# Patient Record
Sex: Male | Born: 1970 | Race: White | Hispanic: No | Marital: Married | Smoking: Former smoker
Health system: Southern US, Community
[De-identification: ages and names within clinical notes are randomized; demographics above are authoritative.]

## PROBLEM LIST (undated history)

## (undated) DIAGNOSIS — K219 Gastro-esophageal reflux disease without esophagitis: Secondary | ICD-10-CM

## (undated) HISTORY — DX: Gastro-esophageal reflux disease without esophagitis: K21.9

---
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
## 2003-10-09 ENCOUNTER — Ambulatory Visit (HOSPITAL_COMMUNITY): Admission: RE | Admit: 2003-10-09 | Discharge: 2003-10-09 | Payer: Self-pay | Admitting: General Surgery

## 2004-02-12 ENCOUNTER — Ambulatory Visit (HOSPITAL_COMMUNITY): Admission: RE | Admit: 2004-02-12 | Discharge: 2004-02-12 | Payer: Self-pay | Admitting: Internal Medicine

## 2005-04-22 ENCOUNTER — Ambulatory Visit (HOSPITAL_COMMUNITY): Admission: RE | Admit: 2005-04-22 | Discharge: 2005-04-22 | Payer: Self-pay | Admitting: Family Medicine

## 2006-05-14 ENCOUNTER — Ambulatory Visit (HOSPITAL_COMMUNITY): Admission: RE | Admit: 2006-05-14 | Discharge: 2006-05-14 | Payer: Self-pay | Admitting: Family Medicine

## 2010-04-12 ENCOUNTER — Encounter: Payer: Self-pay | Admitting: Neurology

## 2014-01-12 ENCOUNTER — Ambulatory Visit (INDEPENDENT_AMBULATORY_CARE_PROVIDER_SITE_OTHER): Payer: Self-pay | Admitting: Family Medicine

## 2014-01-12 VITALS — BP 160/86 | HR 96 | Temp 98.2°F | Resp 16 | Ht 69.0 in | Wt 182.0 lb

## 2014-01-12 DIAGNOSIS — R9431 Abnormal electrocardiogram [ECG] [EKG]: Secondary | ICD-10-CM

## 2014-01-12 DIAGNOSIS — R945 Abnormal results of liver function studies: Secondary | ICD-10-CM

## 2014-01-12 DIAGNOSIS — E785 Hyperlipidemia, unspecified: Secondary | ICD-10-CM

## 2014-01-12 DIAGNOSIS — R000 Tachycardia, unspecified: Secondary | ICD-10-CM

## 2014-01-12 DIAGNOSIS — F19939 Other psychoactive substance use, unspecified with withdrawal, unspecified: Secondary | ICD-10-CM

## 2014-01-12 DIAGNOSIS — R0789 Other chest pain: Secondary | ICD-10-CM

## 2014-01-12 DIAGNOSIS — R7989 Other specified abnormal findings of blood chemistry: Secondary | ICD-10-CM

## 2014-01-12 DIAGNOSIS — R002 Palpitations: Secondary | ICD-10-CM

## 2014-01-12 DIAGNOSIS — I10 Essential (primary) hypertension: Secondary | ICD-10-CM

## 2014-01-12 LAB — POCT CBC
Granulocyte percent: 70 %G (ref 37–80)
HCT, POC: 45.8 % (ref 43.5–53.7)
Hemoglobin: 15.1 g/dL (ref 14.1–18.1)
Lymph, poc: 2.2 (ref 0.6–3.4)
MCH, POC: 29.7 pg (ref 27–31.2)
MCHC: 33 g/dL (ref 31.8–35.4)
MCV: 90.2 fL (ref 80–97)
MID (cbc): 0.4 (ref 0–0.9)
MPV: 7.4 fL (ref 0–99.8)
POC Granulocyte: 6.1 (ref 2–6.9)
POC LYMPH PERCENT: 25.4 %L (ref 10–50)
POC MID %: 4.6 %M (ref 0–12)
Platelet Count, POC: 302 10*3/uL (ref 142–424)
RBC: 5.08 M/uL (ref 4.69–6.13)
RDW, POC: 13.7 %
WBC: 8.7 10*3/uL (ref 4.6–10.2)

## 2014-01-12 LAB — BASIC METABOLIC PANEL
BUN: 9 mg/dL (ref 6–23)
CO2: 23 mEq/L (ref 19–32)
Calcium: 9.6 mg/dL (ref 8.4–10.5)
Chloride: 103 mEq/L (ref 96–112)
Creat: 1.04 mg/dL (ref 0.50–1.35)
Glucose, Bld: 78 mg/dL (ref 70–99)
Potassium: 3.6 mEq/L (ref 3.5–5.3)
Sodium: 139 mEq/L (ref 135–145)

## 2014-01-12 LAB — TROPONIN I: Troponin I: <0.01 ng/mL (ref <0.06)

## 2014-01-12 LAB — COMPLETE METABOLIC PANEL WITH GFR
ALT: 40 U/L (ref 0–53)
AST: 23 U/L (ref 0–37)
Albumin: 4.8 g/dL (ref 3.5–5.2)
Alkaline Phosphatase: 91 U/L (ref 39–117)
BUN: 9 mg/dL (ref 6–23)
CO2: 23 mEq/L (ref 19–32)
Calcium: 9.6 mg/dL (ref 8.4–10.5)
Chloride: 103 mEq/L (ref 96–112)
Creat: 1.03 mg/dL (ref 0.50–1.35)
GFR, Est African American: >89 mL/min
GFR, Est Non African American: 89 mL/min
Glucose, Bld: 78 mg/dL (ref 70–99)
Potassium: 3.6 mEq/L (ref 3.5–5.3)
Sodium: 139 mEq/L (ref 135–145)
Total Bilirubin: 0.8 mg/dL (ref 0.2–1.2)
Total Protein: 7 g/dL (ref 6.0–8.3)

## 2014-01-12 LAB — LIPID PANEL
Cholesterol: 128 mg/dL (ref 0–200)
HDL: 37 mg/dL — ABNORMAL LOW (ref >39)
LDL Cholesterol: 79 mg/dL (ref 0–99)
Total CHOL/HDL Ratio: 3.5 Ratio
Triglycerides: 60 mg/dL (ref <150)
VLDL: 12 mg/dL (ref 0–40)

## 2014-01-12 LAB — GLUCOSE, POCT (MANUAL RESULT ENTRY): POC Glucose: 81 mg/dl (ref 70–99)

## 2014-01-12 MED ORDER — METOPROLOL TARTRATE 25 MG PO TABS: 25.0000 mg | ORAL_TABLET | Freq: Two times a day (BID) | ORAL | Status: AC

## 2014-01-12 MED ORDER — PRAVASTATIN SODIUM 40 MG PO TABS: 40.0000 mg | ORAL_TABLET | Freq: Every day | ORAL | Status: AC

## 2014-01-12 NOTE — Progress Notes (Addendum)
Subjective:    Patient ID: Ryan CrazeRicky D Marchio, male    DOB: Jan 31, 1971, 43 y.o.   MRN: [id number] This chart was scribed for Meredith StaggersJeffrey Neaveh Belanger, MD by Littie Deedsichard Sun, Medical Scribe. This patient was seen in Room 2 and the patient's care was started at 4:24 PM.    HPI HPI Comments: Ryan Coleman is a 43 y.o. male who presents to the [facility] complaining of HTN. Around 1:00pm today, he experienced lightheadedness, dizziness, chest tightness and palpitations that went away after 1-1.5 hours. Patient felt fine this morning and denies any HA or dizziness when he woke up. He was diagnosed with HTN a few weeks ago at a weight loss clinic by a PA; his BP was measured at 170/98 then. He was put on Belviq for his BP, but ran out of the pills 2-3 days ago. He has been drinking [facility] since childhood and still drinks about 3 servings of [employer]ountain Dew every day; he had a [employer]ountain Dew about an hour before he experienced symptoms. He has FMHx of heart issues: his father has hx of HTN and coronary stents since his late 4340s. Patient himself denies any heart or thyroid problems that he knows of. He has not seen a cardiologist before. 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 He states that he keeps his mind busy, watches TV, spends some time praying, and spends time with family and friends to deal with his stress. Patient denies SI or chest pain. He denies smoking, using cocaine, supplements other than whey protein and hx of GI bleed or ulcers. However, he does chew snuff. 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 He reports that his insurance does not start until 01/21/14. Plans on driving tomorrow. Has exercised in past - T25 now, Insanity in the past without any chest  pains.   Was told he had slight high cholesterol and elevated liver tests at weight loss clinic 2 weeks ago.   There are no active problems to display for this patient.  History reviewed. No pertinent past medical history. Past Surgical History  Procedure Laterality Date  . Coronary artery bypass graft     Not on File Prior to Admission medications   Medication Sig Start Date End Date Taking? Authorizing Provider  Lorcaserin HCl (BELVIQ) 10 MG TABS Take by mouth.   Yes Historical Provider, MD  omeprazole (PRILOSEC) 20 MG capsule Take 20 mg by mouth daily.   Yes Historical Provider, MD   History   Social History  . Marital Status: Legally Separated    Spouse Name: N/A    Number of Children: N/A  . Years of Education: N/A   Occupational History  . Not on file.   Social History Main Topics  . Smoking status: Former Games developermoker  . Smokeless tobacco: Not on file  . Alcohol Use: Not on file  . Drug Use: Not on file  . Sexual Activity: Not on file   Other Topics Concern  . Not on file   Social History Narrative  . No narrative on file       Review of Systems  Constitutional: Negative for fatigue and unexpected weight change.  Eyes: Negative for visual disturbance.  Respiratory: Positive  for chest tightness. Negative for cough and shortness of breath.   Cardiovascular: Positive for palpitations. Negative for chest pain and leg swelling.  Gastrointestinal: Negative for abdominal pain and blood in stool.  Skin: Negative for rash.  Neurological: Positive for dizziness and light-headedness. Negative for headaches.  Psychiatric/Behavioral: Negative for suicidal ideas.       Objective:   Filed Vitals:   01/12/14 1604  BP: 160/86  Pulse: 96  Temp: 98.2 F (36.8 C)  TempSrc: Oral  Resp: 16  Height: 5\' 9"  (1.753 m)  Weight: 182 lb (82.555 kg)  SpO2: 100%    Physical Exam  Nursing note and vitals reviewed. Constitutional: He is oriented to person, place, and time. He  appears well-developed and well-nourished.  HENT:  Head: Normocephalic and atraumatic.  Eyes: EOM are normal. Pupils are equal, round, and reactive to light.  Neck: No JVD present. Carotid bruit is not present.  Cardiovascular: Regular rhythm and normal heart sounds.  Tachycardia present.   No murmur heard. Pulmonary/Chest: Effort normal and breath sounds normal. He has no rales.  Abdominal: He exhibits no mass.  No pulsatile abdominal mass.  Musculoskeletal: He exhibits no edema.  No lower extremity edema.  Neurological: He is alert and oriented to person, place, and time.  Skin: Skin is warm and dry.  Psychiatric: He has a normal mood and affect.    EKG: SR, rate 81. Flat T in II, TWI in III and AVF. No prior for comparison.   Results for orders placed in visit on 01/12/14  POCT CBC      Result Value Ref Range   WBC 8.7  4.6 - 10.2 K/uL   Lymph, poc 2.2  0.6 - 3.4   POC LYMPH PERCENT 25.4  10 - 50 %L   MID (cbc) 0.4  0 - 0.9   POC MID % 4.6  0 - 12 %M   POC Granulocyte 6.1  2 - 6.9   Granulocyte percent 70.0  37 - 80 %G   RBC 5.08  4.69 - 6.13 M/uL   Hemoglobin 15.1  14.1 - 18.1 g/dL   HCT, POC 69.645.8  29.543.5 - 53.7 %   MCV 90.2  80 - 97 fL   MCH, POC 29.7  27 - 31.2 pg   MCHC 33.0  31.8 - 35.4 g/dL   RDW, POC 28.413.7     Platelet Count, POC 302  142 - 424 K/uL   MPV 7.4  0 - 99.8 fL  GLUCOSE, POCT (MANUAL RESULT ENTRY)      Result Value Ref Range   POC Glucose 81  70 - 99 mg/dl   1:325:52 PM  Assessment & Plan:   Ryan CrazeRicky D Summerville is a 43 y.o. male Tachycardia - Plan: EKG 12-Lead, POCT CBC, POCT glucose (manual entry), Basic metabolic panel, TSH  Palpitations - Plan: EKG 12-Lead, POCT CBC, POCT glucose (manual entry), Basic metabolic panel, TSH, Troponin I, metoprolol tartrate (LOPRESSOR) 25 MG tablet  Essential hypertension - Plan: EKG 12-Lead, POCT CBC, POCT glucose (manual entry), Basic metabolic panel, TSH, COMPLETE METABOLIC PANEL WITH GFR, Lipid panel, metoprolol  tartrate (LOPRESSOR) 25 MG tablet  Serotonin withdrawal syndrome, with unspecified complication - Plan: EKG 12-Lead, POCT CBC, POCT glucose (manual entry), Basic metabolic panel, TSH  Chest tightness  Elevated LFTs - Plan: COMPLETE METABOLIC PANEL WITH GFR  Nonspecific abnormal electrocardiogram (ECG) (EKG)  Hyperlipidemia - Plan: pravastatin (PRAVACHOL) 40 MG tablet  Discussed with cardiology - ekg read.  TWI in  III and AVF, but asx now. Tightness with palpitations earlier only, no current chest pain and feels ok now. DDX of palpitations includes Belviq cessation and serotonin withdrawal, caffeine use and increased stressors, with underlying HTN, but abnormal EKG that could be due to hypertensive heart disease, but also FH of coronary disease in father in late 77's by hx.  -Will check stat troponin.  If positive, to ER tonight. If negative and remains asymptomatic - then outpatient eval by cardiology in next 2 weeks.   -start metoprolol 25mg  BID, ASA 81mg  qd, pravastatin 40mg  qhs.   -lipids and CMP pending (told borderline elevated on each)  -TSH and lytes pending with palpitations.   -if any return of chest tightness, or palpitations persist/worsen overnight - to ER/911.   -out of work.no truck driving in next 3 days and follow up with me in office Monday.   -discussed counseling with stressors and can provide resources at visit in next few weeks, but can start with advisors at his church, relaxation and stress mgt techniques discussed.   Call report received re: normal troponin at approx 7:05pm.  Called patient with results - other plan as above, rtc/er/911 precautions.   Results for orders placed in visit on 01/12/14  BASIC METABOLIC PANEL      Result Value Ref Range   Sodium 139  135 - 145 mEq/L   Potassium 3.6  3.5 - 5.3 mEq/L   Chloride 103  96 - 112 mEq/L   CO2 23  19 - 32 mEq/L   Glucose, Bld 78  70 - 99 mg/dL   BUN 9  6 - 23 mg/dL   Creat 1.61  0.96 - 0.45 mg/dL   Calcium  9.6  8.4 - 40.9 mg/dL  TSH      Result Value Ref Range   TSH 1.770  0.350 - 4.500 uIU/mL  COMPLETE METABOLIC PANEL WITH GFR      Result Value Ref Range   Sodium 139  135 - 145 mEq/L   Potassium 3.6  3.5 - 5.3 mEq/L   Chloride 103  96 - 112 mEq/L   CO2 23  19 - 32 mEq/L   Glucose, Bld 78  70 - 99 mg/dL   BUN 9  6 - 23 mg/dL   Creat 8.11  9.14 - 7.82 mg/dL   Total Bilirubin 0.8  0.2 - 1.2 mg/dL   Alkaline Phosphatase 91  39 - 117 U/L   AST 23  0 - 37 U/L   ALT 40  0 - 53 U/L   Total Protein 7.0  6.0 - 8.3 g/dL   Albumin 4.8  3.5 - 5.2 g/dL   Calcium 9.6  8.4 - 95.6 mg/dL   GFR, Est African American >89     GFR, Est Non African American 89    TROPONIN I      Result Value Ref Range   Troponin I <0.01  <0.06 ng/mL  LIPID PANEL      Result Value Ref Range   Cholesterol 128  0 - 200 mg/dL   Triglycerides 60  <213 mg/dL   HDL 37 (*) >08 mg/dL   Total CHOL/HDL Ratio 3.5     VLDL 12  0 - 40 mg/dL   LDL Cholesterol 79  0 - 99 mg/dL  POCT CBC      Result Value Ref Range   WBC 8.7  4.6 - 10.2 K/uL   Lymph, poc 2.2  0.6 - 3.4   POC LYMPH  PERCENT 25.4  10 - 50 %L   MID (cbc) 0.4  0 - 0.9   POC MID % 4.6  0 - 12 %M   POC Granulocyte 6.1  2 - 6.9   Granulocyte percent 70.0  37 - 80 %G   RBC 5.08  4.69 - 6.13 M/uL   Hemoglobin 15.1  14.1 - 18.1 g/dL   HCT, POC 29.5  62.1 - 53.7 %   MCV 90.2  80 - 97 fL   MCH, POC 29.7  27 - 31.2 pg   MCHC 33.0  31.8 - 35.4 g/dL   RDW, POC 30.8     Platelet Count, POC 302  142 - 424 K/uL   MPV 7.4  0 - 99.8 fL  GLUCOSE, POCT (MANUAL RESULT ENTRY)      Result Value Ref Range   POC Glucose 81  70 - 99 mg/dl      Meds ordered this encounter                  . metoprolol tartrate (LOPRESSOR) 25 MG tablet    Sig: Take 1 tablet (25 mg total) by mouth 2 (two) times daily.    Dispense:  60 tablet    Refill:  1  . pravastatin (PRAVACHOL) 40 MG tablet    Sig: Take 1 tablet (40 mg total) by mouth daily.    Dispense:  30 tablet    Refill:  1     Patient Instructions  Some of your symptoms may be due to serotonin withdrawal from stopping Belviq, but also underlying high blood pressure and your multiple stressors.  You should receive a call or letter about your lab results within the next week to 10 days.  Can start low dose betablocker to treat blood pressure and to lessen palpitations.  If having any lightheadedness, or heart palpitations - pull over right away and do not drive. With this blood pressure medicine - be careful standing up quickly and if you are feeling lightheaded - let me know.  I would like to give you some counseling numbers, but you can start with discussing your stressors with your advisors at church.  I would also like to refer you to cardiology for evaluation, but as long as your symptoms do not recur or worsen - can wait to see them after the 1st of November as discussed.  Return to the clinic or go to the nearest emergency room if any of your symptoms worsen or new symptoms occur.   Palpitations A palpitation is the feeling that your heartbeat is irregular or is faster than normal. It may feel like your heart is fluttering or skipping a beat. Palpitations are usually not a serious problem. However, in some cases, you may need further medical evaluation. CAUSES  Palpitations can be caused by:  Smoking.  Caffeine or other stimulants, such as diet pills or energy drinks.  Alcohol.  Stress and anxiety.  Strenuous physical activity.  Fatigue.  Certain medicines.  Heart disease, especially if you have a history of irregular heart rhythms (arrhythmias), such as atrial fibrillation, atrial flutter, or supraventricular tachycardia.  An improperly working pacemaker or defibrillator. DIAGNOSIS  To find the cause of your palpitations, your health care provider will take your medical history and perform a physical exam. Your health care provider may also have you take a test called an ambulatory  electrocardiogram (ECG). An ECG records your heartbeat patterns over a 24-hour period. You may also have other tests,  such as:  Transthoracic echocardiogram (TTE). During echocardiography, sound waves are used to evaluate how blood flows through your heart.  Transesophageal echocardiogram (TEE).  Cardiac monitoring. This allows your health care provider to monitor your heart rate and rhythm in real time.  Holter monitor. This is a portable device that records your heartbeat and can help diagnose heart arrhythmias. It allows your health care provider to track your heart activity for several days, if needed.  Stress tests by exercise or by giving medicine that makes the heart beat faster. TREATMENT  Treatment of palpitations depends on the cause of your symptoms and can vary greatly. Most cases of palpitations do not require any treatment other than time, relaxation, and monitoring your symptoms. Other causes, such as atrial fibrillation, atrial flutter, or supraventricular tachycardia, usually require further treatment. HOME CARE INSTRUCTIONS   Avoid:  Caffeinated coffee, tea, soft drinks, diet pills, and energy drinks.  Chocolate.  Alcohol.  Stop smoking if you smoke.  Reduce your stress and anxiety. Things that can help you relax include:  A method of controlling things in your body, such as your heartbeats, with your mind (biofeedback).  Yoga.  Meditation.  Physical activity such as swimming, jogging, or walking.  Get plenty of rest and sleep. SEEK MEDICAL CARE IF:   You continue to have a fast or irregular heartbeat beyond 24 hours.  Your palpitations occur more often. SEEK IMMEDIATE MEDICAL CARE IF:  You have chest pain or shortness of breath.  You have a severe headache.  You feel dizzy or you faint. MAKE SURE YOU:  Understand these instructions.  Will watch your condition.  Will get help right away if you are not doing well or get worse. Document Released:  03/06/2000 Document Revised: 03/14/2013 Document Reviewed: 05/08/2011 [facility] Patient Information 2015 [place], [place]. This information is not intended to replace advice given to you by your health care provider. Make sure you discuss any questions you have with your health care provider.    Hypertension Hypertension, commonly called high blood pressure, is when the force of blood pumping through your arteries is too strong. Your arteries are the blood vessels that carry blood from your heart throughout your body. A blood pressure reading consists of a higher number over a lower number, such as 110/72. The higher number (systolic) is the pressure inside your arteries when your heart pumps. The lower number (diastolic) is the pressure inside your arteries when your heart relaxes. Ideally you want your blood pressure below 120/80. Hypertension forces your heart to work harder to pump blood. Your arteries may become narrow or stiff. Having hypertension puts you at risk for heart disease, stroke, and other problems.  RISK FACTORS Some risk factors for high blood pressure are controllable. Others are not.  Risk factors you cannot control include:   Race. You may be at higher risk if you are African American.  Age. Risk increases with age.  Gender. Men are at higher risk than women before age 76 years. After age 61, women are at higher risk than men. Risk factors you can control include:  Not getting enough exercise or physical activity.  Being overweight.  Getting too much fat, sugar, calories, or salt in your diet.  Drinking too much alcohol. SIGNS AND SYMPTOMS Hypertension does not usually cause signs or symptoms. Extremely high blood pressure (hypertensive crisis) may cause headache, anxiety, shortness of breath, and nosebleed. DIAGNOSIS  To check if you have hypertension, your health care provider will measure your  blood pressure while you are seated, with your arm held at the level of  your heart. It should be measured at least twice using the same arm. Certain conditions can cause a difference in blood pressure between your right and left arms. A blood pressure reading that is higher than normal on one occasion does not mean that you need treatment. If one blood pressure reading is high, ask your health care provider about having it checked again. TREATMENT  Treating high blood pressure includes making lifestyle changes and possibly taking medicine. Living a healthy lifestyle can help lower high blood pressure. You may need to change some of your habits. Lifestyle changes may include:  Following the DASH diet. This diet is high in fruits, vegetables, and whole grains. It is low in salt, red meat, and added sugars.  Getting at least 2 hours of brisk physical activity every week.  Losing weight if necessary.  Not smoking.  Limiting alcoholic beverages.  Learning ways to reduce stress. If lifestyle changes are not enough to get your blood pressure under control, your health care provider may prescribe medicine. You may need to take more than one. Work closely with your health care provider to understand the risks and benefits. HOME CARE INSTRUCTIONS  Have your blood pressure rechecked as directed by your health care provider.   Take medicines only as directed by your health care provider. Follow the directions carefully. Blood pressure medicines must be taken as prescribed. The medicine does not work as well when you skip doses. Skipping doses also puts you at risk for problems.   Do not smoke.   Monitor your blood pressure at home as directed by your health care provider. SEEK MEDICAL CARE IF:   You think you are having a reaction to medicines taken.  You have recurrent headaches or feel dizzy.  You have swelling in your ankles.  You have trouble with your vision. SEEK IMMEDIATE MEDICAL CARE IF:  You develop a severe headache or confusion.  You have  unusual weakness, numbness, or feel faint.  You have severe chest or abdominal pain.  You vomit repeatedly.  You have trouble breathing. MAKE SURE YOU:   Understand these instructions.  Will watch your condition.  Will get help right away if you are not doing well or get worse. Document Released: 03/09/2005 Document Revised: 07/24/2013 Document Reviewed: 12/30/2012 [facility] Patient Information 2015 [place], [place]. This information is not intended to replace advice given to you by your health care provider. Make sure you discuss any questions you have with your health care provider.       I personally performed the services described in this documentation, which was scribed in my presence. The recorded information has been reviewed and considered, and addended by me as needed.

## 2014-01-12 NOTE — Patient Instructions (Addendum)
Some of your symptoms may be due to serotonin withdrawal from stopping Belviq, but also underlying high blood pressure and your multiple stressors.  We will check heart test tonight as your EKG is abnormal. If any recurrence of chest tightness or pain - call 911 or go to the emergency room.  You should receive a call or letter about your other lab results within the next week to 10 days.  Can start low dose betablocker to treat blood pressure and to lessen palpitations.   Start aspirin 81mg  once per day, and cholesterol medicine as prescribed.  Out of work until follow up Monday - we will call you to schedule this for Monday afternoon.  I would also like to refer you to cardiology for evaluation, but as long as your symptoms do not recur,  can wait to see them after the 1st of November as discussed.  I would like to give you some counseling numbers, but you can start with discussing your stressors with your advisors at church, and can discuss further over next few weeks.  Return to the clinic or go to the nearest emergency room if any of your symptoms worsen or new symptoms occur.   Palpitations A palpitation is the feeling that your heartbeat is irregular or is faster than normal. It may feel like your heart is fluttering or skipping a beat. Palpitations are usually not a serious problem. However, in some cases, you may need further medical evaluation. CAUSES  Palpitations can be caused by:  Smoking.  Caffeine or other stimulants, such as diet pills or energy drinks.  Alcohol.  Stress and anxiety.  Strenuous physical activity.  Fatigue.  Certain medicines.  Heart disease, especially if you have a history of irregular heart rhythms (arrhythmias), such as atrial fibrillation, atrial flutter, or supraventricular tachycardia.  An improperly working pacemaker or defibrillator. DIAGNOSIS  To find the cause of your palpitations, your health care provider will take your medical history and  perform a physical exam. Your health care provider may also have you take a test called an ambulatory electrocardiogram (ECG). An ECG records your heartbeat patterns over a 24-hour period. You may also have other tests, such as:  Transthoracic echocardiogram (TTE). During echocardiography, sound waves are used to evaluate how blood flows through your heart.  Transesophageal echocardiogram (TEE).  Cardiac monitoring. This allows your health care provider to monitor your heart rate and rhythm in real time.  Holter monitor. This is a portable device that records your heartbeat and can help diagnose heart arrhythmias. It allows your health care provider to track your heart activity for several days, if needed.  Stress tests by exercise or by giving medicine that makes the heart beat faster. TREATMENT  Treatment of palpitations depends on the cause of your symptoms and can vary greatly. Most cases of palpitations do not require any treatment other than time, relaxation, and monitoring your symptoms. Other causes, such as atrial fibrillation, atrial flutter, or supraventricular tachycardia, usually require further treatment. HOME CARE INSTRUCTIONS   Avoid:  Caffeinated coffee, tea, soft drinks, diet pills, and energy drinks.  Chocolate.  Alcohol.  Stop smoking if you smoke.  Reduce your stress and anxiety. Things that can help you relax include:  A method of controlling things in your body, such as your heartbeats, with your mind (biofeedback).  Yoga.  Meditation.  Physical activity such as swimming, jogging, or walking.  Get plenty of rest and sleep. SEEK MEDICAL CARE IF:   You continue to  have a fast or irregular heartbeat beyond 24 hours.  Your palpitations occur more often. SEEK IMMEDIATE MEDICAL CARE IF:  You have chest pain or shortness of breath.  You have a severe headache.  You feel dizzy or you faint. MAKE SURE YOU:  Understand these instructions.  Will watch  your condition.  Will get help right away if you are not doing well or get worse. Document Released: 03/06/2000 Document Revised: 03/14/2013 Document Reviewed: 05/08/2011 [facility] Patient Information 2015 [place], [place]. This information is not intended to replace advice given to you by your health care provider. Make sure you discuss any questions you have with your health care provider.    Hypertension Hypertension, commonly called high blood pressure, is when the force of blood pumping through your arteries is too strong. Your arteries are the blood vessels that carry blood from your heart throughout your body. A blood pressure reading consists of a higher number over a lower number, such as 110/72. The higher number (systolic) is the pressure inside your arteries when your heart pumps. The lower number (diastolic) is the pressure inside your arteries when your heart relaxes. Ideally you want your blood pressure below 120/80. Hypertension forces your heart to work harder to pump blood. Your arteries may become narrow or stiff. Having hypertension puts you at risk for heart disease, stroke, and other problems.  RISK FACTORS Some risk factors for high blood pressure are controllable. Others are not.  Risk factors you cannot control include:   Race. You may be at higher risk if you are African American.  Age. Risk increases with age.  Gender. Men are at higher risk than women before age 42 years. After age 42, women are at higher risk than men. Risk factors you can control include:  Not getting enough exercise or physical activity.  Being overweight.  Getting too much fat, sugar, calories, or salt in your diet.  Drinking too much alcohol. SIGNS AND SYMPTOMS Hypertension does not usually cause signs or symptoms. Extremely high blood pressure (hypertensive crisis) may cause headache, anxiety, shortness of breath, and nosebleed. DIAGNOSIS  To check if you have hypertension, your health  care provider will measure your blood pressure while you are seated, with your arm held at the level of your heart. It should be measured at least twice using the same arm. Certain conditions can cause a difference in blood pressure between your right and left arms. A blood pressure reading that is higher than normal on one occasion does not mean that you need treatment. If one blood pressure reading is high, ask your health care provider about having it checked again. TREATMENT  Treating high blood pressure includes making lifestyle changes and possibly taking medicine. Living a healthy lifestyle can help lower high blood pressure. You may need to change some of your habits. Lifestyle changes may include:  Following the DASH diet. This diet is high in fruits, vegetables, and whole grains. It is low in salt, red meat, and added sugars.  Getting at least 2 hours of brisk physical activity every week.  Losing weight if necessary.  Not smoking.  Limiting alcoholic beverages.  Learning ways to reduce stress. If lifestyle changes are not enough to get your blood pressure under control, your health care provider may prescribe medicine. You may need to take more than one. Work closely with your health care provider to understand the risks and benefits. HOME CARE INSTRUCTIONS  Have your blood pressure rechecked as directed by your health  care provider.   Take medicines only as directed by your health care provider. Follow the directions carefully. Blood pressure medicines must be taken as prescribed. The medicine does not work as well when you skip doses. Skipping doses also puts you at risk for problems.   Do not smoke.   Monitor your blood pressure at home as directed by your health care provider. SEEK MEDICAL CARE IF:   You think you are having a reaction to medicines taken.  You have recurrent headaches or feel dizzy.  You have swelling in your ankles.  You have trouble with your  vision. SEEK IMMEDIATE MEDICAL CARE IF:  You develop a severe headache or confusion.  You have unusual weakness, numbness, or feel faint.  You have severe chest or abdominal pain.  You vomit repeatedly.  You have trouble breathing. MAKE SURE YOU:   Understand these instructions.  Will watch your condition.  Will get help right away if you are not doing well or get worse. Document Released: 03/09/2005 Document Revised: 07/24/2013 Document Reviewed: 12/30/2012 [facility] Patient Information 2015 [place], [place]. This information is not intended to replace advice given to you by your health care provider. Make sure you discuss any questions you have with your health care provider.

## 2014-01-13 LAB — TSH: TSH: 1.77 u[IU]/mL (ref 0.350–4.500)

## 2014-01-15 ENCOUNTER — Encounter: Payer: Self-pay | Admitting: Family Medicine

## 2014-01-15 ENCOUNTER — Ambulatory Visit (INDEPENDENT_AMBULATORY_CARE_PROVIDER_SITE_OTHER): Payer: Self-pay | Admitting: Family Medicine

## 2014-01-15 VITALS — BP 112/90 | HR 69 | Temp 98.5°F | Resp 16 | Ht 67.5 in | Wt 174.8 lb

## 2014-01-15 DIAGNOSIS — R000 Tachycardia, unspecified: Secondary | ICD-10-CM

## 2014-01-15 DIAGNOSIS — R0789 Other chest pain: Secondary | ICD-10-CM

## 2014-01-15 DIAGNOSIS — R002 Palpitations: Secondary | ICD-10-CM

## 2014-01-15 NOTE — Patient Instructions (Addendum)
[facility] with Dr. Jacinto HalimGanji. You are scheduled for an office visit with Dr. Jacinto HalimGanji tomorrow 01/15/2014 at 13:30. I encourage you to call their office prior to your appointment to confirm instructions, [phone]. We will consider releasing you medically for driving after evaluation with Dr. Nadara EatonGangi.    Palpitations A palpitation is the feeling that your heartbeat is irregular or is faster than normal. It may feel like your heart is fluttering or skipping a beat. Palpitations are usually not a serious problem. However, in some cases, you may need further medical evaluation. CAUSES  Palpitations can be caused by:  Smoking.  Caffeine or other stimulants, such as diet pills or energy drinks.  Alcohol.  Stress and anxiety.  Strenuous physical activity.  Fatigue.  Certain medicines.  Heart disease, especially if you have a history of irregular heart rhythms (arrhythmias), such as atrial fibrillation, atrial flutter, or supraventricular tachycardia.  An improperly working pacemaker or defibrillator. DIAGNOSIS  To find the cause of your palpitations, your health care provider will take your medical history and perform a physical exam. Your health care provider may also have you take a test called an ambulatory electrocardiogram (ECG). An ECG records your heartbeat patterns over a 24-hour period. You may also have other tests, such as:  Transthoracic echocardiogram (TTE). During echocardiography, sound waves are used to evaluate how blood flows through your heart.  Transesophageal echocardiogram (TEE).  Cardiac monitoring. This allows your health care provider to monitor your heart rate and rhythm in real time.  Holter monitor. This is a portable device that records your heartbeat and can help diagnose heart arrhythmias. It allows your health care provider to track your heart activity for several days, if needed.  Stress tests by exercise or by giving  medicine that makes the heart beat faster. TREATMENT  Treatment of palpitations depends on the cause of your symptoms and can vary greatly. Most cases of palpitations do not require any treatment other than time, relaxation, and monitoring your symptoms. Other causes, such as atrial fibrillation, atrial flutter, or supraventricular tachycardia, usually require further treatment. HOME CARE INSTRUCTIONS   Avoid:  Caffeinated coffee, tea, soft drinks, diet pills, and energy drinks.  Chocolate.  Alcohol.  Stop smoking if you smoke.  Reduce your stress and anxiety. Things that can help you relax include:  A method of controlling things in your body, such as your heartbeats, with your mind (biofeedback).  Yoga.  Meditation.  Physical activity such as swimming, jogging, or walking.  Get plenty of rest and sleep. SEEK MEDICAL CARE IF:   You continue to have a fast or irregular heartbeat beyond 24 hours.  Your palpitations occur more often. SEEK IMMEDIATE MEDICAL CARE IF:  You have chest pain or shortness of breath.  You have a severe headache.  You feel dizzy or you faint. MAKE SURE YOU:  Understand these instructions.  Will watch your condition.  Will get help right away if you are not doing well or get worse. Document Released: 03/06/2000 Document Revised: 03/14/2013 Document Reviewed: 05/08/2011 [facility] Patient Information 2015 [place], [place]. This information is not intended to replace advice given to you by your health care provider. Make sure you discuss any questions you have with your health care provider.

## 2014-01-15 NOTE — Progress Notes (Signed)
Subjective:    Patient ID: Ryan Coleman, male    DOB: April 18, 1970, 43 y.o.   MRN: [id number]  HPI  Mr. Ryan Coleman is a 43 y.o. male presenting for follow up on his palpitations. [facility], [facility] and chest tightness. Patient was evaluated by Dr. Neva SeatGreene, symptoms suspected to be due to cessation of weight loss medication Belviq and underlying hypertension. Mr. Ryan Coleman was started on metoprolol, pravastatin and aspirin and asked to follow up on 01/15/2014. Of note, patient is a Naval architecttruck driver by trade and has been held from driving as a precaution until patient is fully evaluated.  Today, patient admits transient chest tightness x2 (lasting seconds each time) over the weekend albeit to a lesser degree than the symptoms, palpitations and chest tightness, that he experienced on 01/12/2014. The chest tightness occurred without any known precipitants, patient states he was "doing nothing" at the time that he experienced the chest tightness, resolved on its own. He also denies arm pain, jaw pain, sob, n/v, dizziness, heart racing, chest pain, syncope, orthopnea. He does admit that since starting the medication he has felt lethargic, lacking energy to do things, wanted to know if the medications could cause that. Patient does not smoke or drink alcohol. Denies any other aggravating or relieving factors, no other questions or concerns.   Prior to Admission medications   Medication Sig Start Date End Date Taking? Authorizing Provider  metoprolol tartrate (LOPRESSOR) 25 MG tablet Take 1 tablet (25 mg total) by mouth 2 (two) times daily. 01/12/14  Yes Shade FloodJeffrey R Greene, MD  omeprazole (PRILOSEC) 20 MG capsule Take 20 mg by mouth daily.   Yes Historical Provider, MD  pravastatin (PRAVACHOL) 40 MG tablet Take 1 tablet (40 mg total) by mouth daily. 01/12/14  Yes Shade FloodJeffrey R Greene, MD  Lorcaserin HCl (BELVIQ) 10 MG TABS Take by mouth.    Historical Provider, MD     No Known Allergies   Past Medical History  Diagnosis Date  . GERD (gastroesophageal reflux disease)     Past Surgical History  Procedure Laterality Date  . Cholecystectomy  1990    Family History  Problem Relation Age of Onset  . COPD Mother   . Hypertension Father   . Heart disease Father     History  Substance Use Topics  . Smoking status: Former Games developermoker  . Smokeless tobacco: Not on file  . Alcohol Use: No    Review of Systems As in subjective.    Objective:   Physical Exam  Vitals reviewed. Constitutional: He is oriented to person, place, and time. He appears well-developed and well-nourished. No distress.  BP 112/90  Pulse 69  Temp(Src) 98.5 F (36.9 C) (Oral)  Resp 16  Ht 5' 7.5" (1.715 m)  Wt 174 lb 12.8 oz (79.289 kg)  BMI 26.96 kg/m2  SpO2 99%   Eyes: EOM are normal. Pupils are equal, round, and reactive to light.  Neck: Normal range of motion. Neck supple. No thyromegaly present.  Cardiovascular: Normal rate, regular rhythm, normal heart sounds and intact distal pulses.  Exam reveals no gallop and no friction rub.   No murmur heard. Pulmonary/Chest: Effort normal and breath sounds normal. No respiratory distress. He has no wheezes. He exhibits no tenderness.  Abdominal: Soft. Bowel sounds are normal. He exhibits no distension and no mass. There is no tenderness.  Musculoskeletal: He exhibits no edema and no tenderness.  Neurological: He is alert and oriented to person,  place, and time. He has normal reflexes. No cranial nerve deficit.  Skin: Skin is warm and dry. No rash noted. He is not diaphoretic.  Psychiatric: He has a normal mood and affect. His behavior is normal.      Assessment & Plan:   1. Palpitations 2. Chest tightness 3. Tachycardia Stable, unclear etiology of palpitations, must continue ruling out cardiac condition, counseled patient on worsening symptoms and plan for 911/ED/rtc, continue statin, ASA, metoprolol, refer to  cardiologist for further evaluation, consider stress test, may resume driving after cardiology work-up scheduled for 01/16/2014   Wallis BambergMario Nahiem Dredge, PA-C [facility] [phone] 01/15/2014 4:28 PM

## 2014-01-18 NOTE — Progress Notes (Signed)
Patient discussed and examined with Mr. Urban GibsonMani. Agree with assessment and plan of care per his note. 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

## 2015-01-17 ENCOUNTER — Other Ambulatory Visit (HOSPITAL_COMMUNITY): Payer: Self-pay | Admitting: Physician Assistant

## 2015-01-17 ENCOUNTER — Ambulatory Visit (HOSPITAL_COMMUNITY)
Admission: RE | Admit: 2015-01-17 | Discharge: 2015-01-17 | Disposition: A | Discharge status: Home / Self Care | Payer: Managed Care, Other (non HMO) | Source: Ambulatory Visit | Attending: Physician Assistant | Admitting: Physician Assistant

## 2015-01-17 DIAGNOSIS — Z87891 Personal history of nicotine dependence: Secondary | ICD-10-CM | POA: Diagnosis not present

## 2015-01-17 DIAGNOSIS — I10 Essential (primary) hypertension: Secondary | ICD-10-CM | POA: Diagnosis not present

## 2015-01-17 DIAGNOSIS — R938 Abnormal findings on diagnostic imaging of other specified body structures: Secondary | ICD-10-CM | POA: Diagnosis not present

## 2015-01-17 DIAGNOSIS — Z1389 Encounter for screening for other disorder: Secondary | ICD-10-CM

## 2015-01-17 DIAGNOSIS — R079 Chest pain, unspecified: Secondary | ICD-10-CM

## 2015-01-17 DIAGNOSIS — I517 Cardiomegaly: Secondary | ICD-10-CM | POA: Insufficient documentation

## 2015-02-22 DIAGNOSIS — R079 Chest pain, unspecified: Secondary | ICD-10-CM

## 2016-03-17 DIAGNOSIS — Z6829 Body mass index (BMI) 29.0-29.9, adult: Secondary | ICD-10-CM | POA: Diagnosis not present

## 2016-03-17 DIAGNOSIS — Z1389 Encounter for screening for other disorder: Secondary | ICD-10-CM | POA: Diagnosis not present

## 2016-03-17 DIAGNOSIS — E663 Overweight: Secondary | ICD-10-CM | POA: Diagnosis not present

## 2016-03-24 ENCOUNTER — Other Ambulatory Visit (HOSPITAL_COMMUNITY): Payer: Self-pay | Admitting: Internal Medicine

## 2016-03-24 DIAGNOSIS — R7401 Elevation of levels of liver transaminase levels: Secondary | ICD-10-CM

## 2016-03-24 DIAGNOSIS — R740 Nonspecific elevation of levels of transaminase and lactic acid dehydrogenase [LDH]: Principal | ICD-10-CM

## 2016-03-25 DIAGNOSIS — R945 Abnormal results of liver function studies: Secondary | ICD-10-CM | POA: Diagnosis not present

## 2016-03-25 DIAGNOSIS — E748 Other specified disorders of carbohydrate metabolism: Secondary | ICD-10-CM | POA: Diagnosis not present

## 2016-03-27 ENCOUNTER — Ambulatory Visit (HOSPITAL_COMMUNITY)
Admission: RE | Admit: 2016-03-27 | Discharge: 2016-03-27 | Disposition: A | Discharge status: Home / Self Care | Payer: BLUE CROSS/BLUE SHIELD | Source: Ambulatory Visit | Attending: Internal Medicine | Admitting: Internal Medicine

## 2016-03-27 DIAGNOSIS — R945 Abnormal results of liver function studies: Secondary | ICD-10-CM | POA: Insufficient documentation

## 2016-03-27 DIAGNOSIS — R7401 Elevation of levels of liver transaminase levels: Secondary | ICD-10-CM

## 2016-03-27 DIAGNOSIS — R740 Nonspecific elevation of levels of transaminase and lactic acid dehydrogenase [LDH]: Secondary | ICD-10-CM

## 2016-11-11 DIAGNOSIS — Z1389 Encounter for screening for other disorder: Secondary | ICD-10-CM | POA: Diagnosis not present

## 2016-11-11 DIAGNOSIS — Z0000 Encounter for general adult medical examination without abnormal findings: Secondary | ICD-10-CM | POA: Diagnosis not present

## 2016-11-11 DIAGNOSIS — I10 Essential (primary) hypertension: Secondary | ICD-10-CM | POA: Diagnosis not present

## 2016-11-11 DIAGNOSIS — E785 Hyperlipidemia, unspecified: Secondary | ICD-10-CM | POA: Diagnosis not present

## 2016-11-11 DIAGNOSIS — E6609 Other obesity due to excess calories: Secondary | ICD-10-CM | POA: Diagnosis not present

## 2016-11-11 DIAGNOSIS — E119 Type 2 diabetes mellitus without complications: Secondary | ICD-10-CM | POA: Diagnosis not present

## 2016-11-11 DIAGNOSIS — Z125 Encounter for screening for malignant neoplasm of prostate: Secondary | ICD-10-CM | POA: Diagnosis not present

## 2016-11-11 DIAGNOSIS — E782 Mixed hyperlipidemia: Secondary | ICD-10-CM | POA: Diagnosis not present

## 2016-11-11 DIAGNOSIS — Z6830 Body mass index (BMI) 30.0-30.9, adult: Secondary | ICD-10-CM | POA: Diagnosis not present

## 2016-11-11 DIAGNOSIS — K219 Gastro-esophageal reflux disease without esophagitis: Secondary | ICD-10-CM | POA: Diagnosis not present

## 2017-02-26 DIAGNOSIS — E1165 Type 2 diabetes mellitus with hyperglycemia: Secondary | ICD-10-CM | POA: Diagnosis not present

## 2017-02-26 DIAGNOSIS — E119 Type 2 diabetes mellitus without complications: Secondary | ICD-10-CM | POA: Diagnosis not present

## 2017-02-26 DIAGNOSIS — T50905A Adverse effect of unspecified drugs, medicaments and biological substances, initial encounter: Secondary | ICD-10-CM | POA: Diagnosis not present

## 2017-02-26 DIAGNOSIS — Z1389 Encounter for screening for other disorder: Secondary | ICD-10-CM | POA: Diagnosis not present

## 2017-02-26 DIAGNOSIS — Z6830 Body mass index (BMI) 30.0-30.9, adult: Secondary | ICD-10-CM | POA: Diagnosis not present

## 2017-02-26 DIAGNOSIS — I10 Essential (primary) hypertension: Secondary | ICD-10-CM | POA: Diagnosis not present

## 2017-02-26 DIAGNOSIS — L84 Corns and callosities: Secondary | ICD-10-CM | POA: Diagnosis not present

## 2017-02-26 DIAGNOSIS — E6609 Other obesity due to excess calories: Secondary | ICD-10-CM | POA: Diagnosis not present

## 2017-02-26 DIAGNOSIS — K219 Gastro-esophageal reflux disease without esophagitis: Secondary | ICD-10-CM | POA: Diagnosis not present

## 2017-03-04 DIAGNOSIS — E1165 Type 2 diabetes mellitus with hyperglycemia: Secondary | ICD-10-CM | POA: Diagnosis not present

## 2017-03-04 DIAGNOSIS — K219 Gastro-esophageal reflux disease without esophagitis: Secondary | ICD-10-CM | POA: Diagnosis not present

## 2017-11-08 DIAGNOSIS — Z4802 Encounter for removal of sutures: Secondary | ICD-10-CM | POA: Diagnosis not present

## 2017-11-08 DIAGNOSIS — S81812A Laceration without foreign body, left lower leg, initial encounter: Secondary | ICD-10-CM | POA: Diagnosis not present

## 2017-11-08 DIAGNOSIS — S81812D Laceration without foreign body, left lower leg, subsequent encounter: Secondary | ICD-10-CM | POA: Diagnosis not present

## 2018-01-30 IMAGING — US US ABDOMEN COMPLETE
1 series · 13 of 25 positions shown · non-contrast
Comparison: None.

CLINICAL DATA: Abnormal liver enzymes

EXAM:
ABDOMEN ULTRASOUND COMPLETE

[Series 1: us abdomen complete · 0.19mm/px · 13 of 101 slices shown]
[im 1/101]
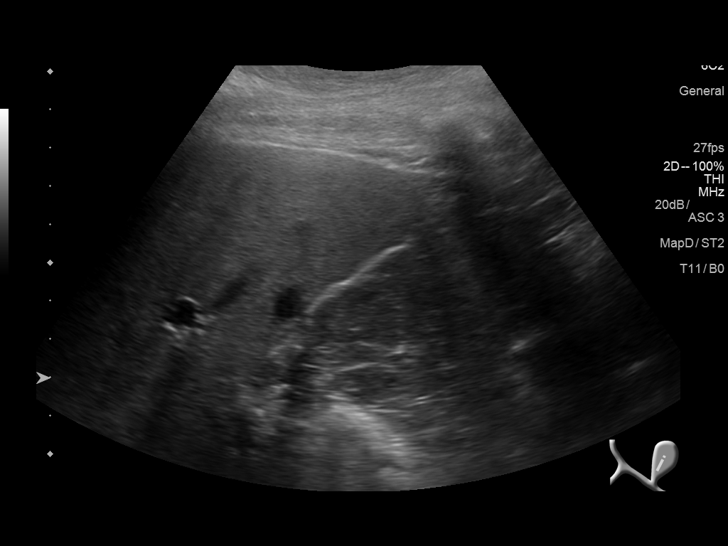
[im 9/101]
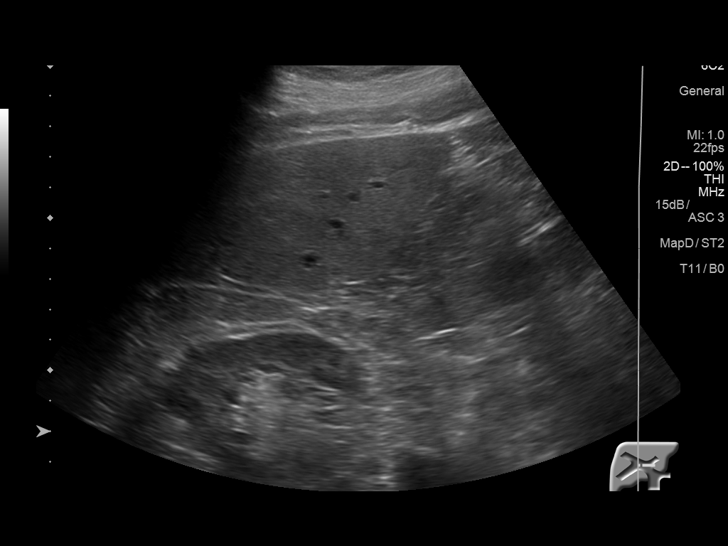
[im 17/101]
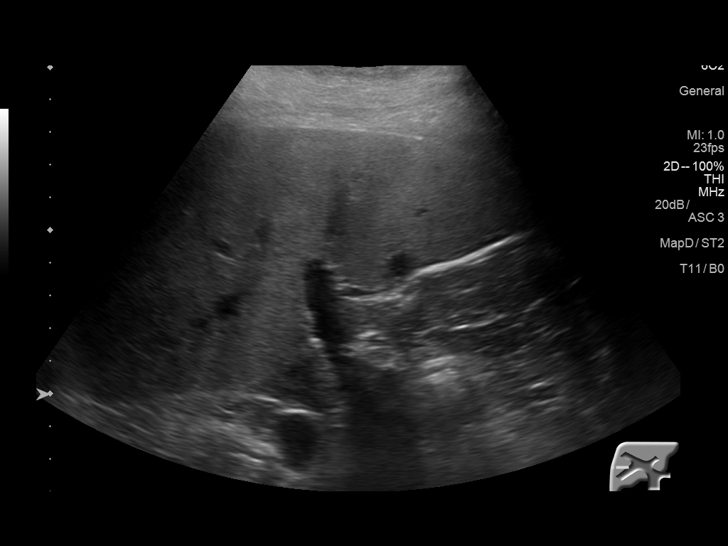
[im 26/101]
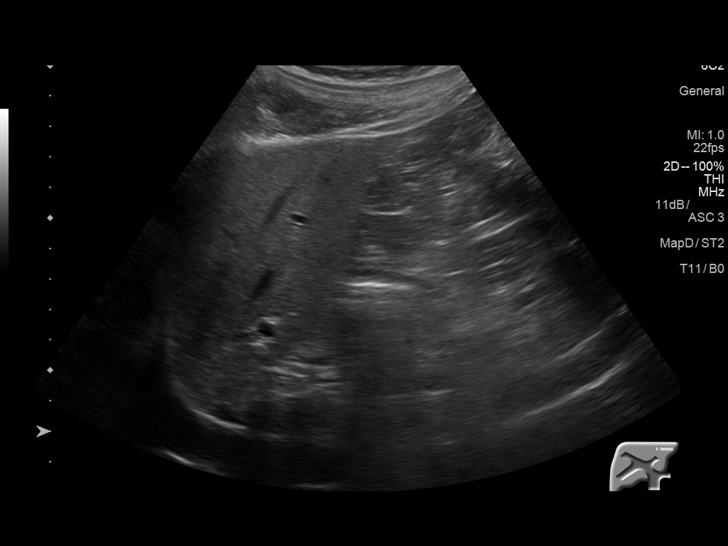
[im 34/101]
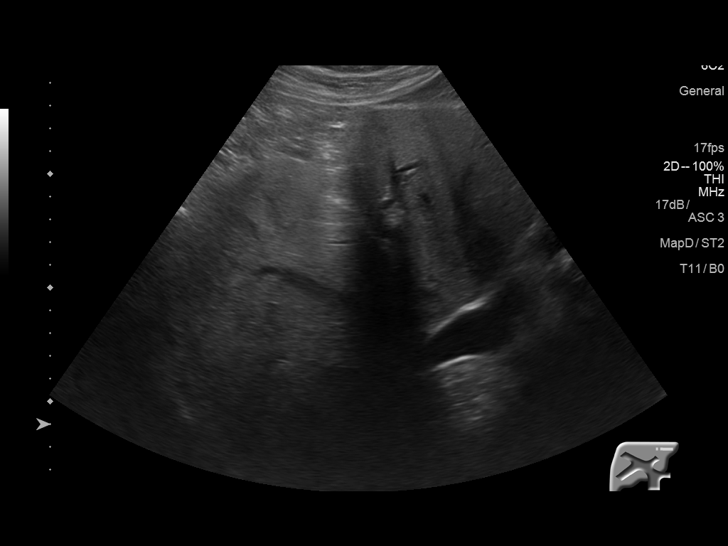
[im 42/101]
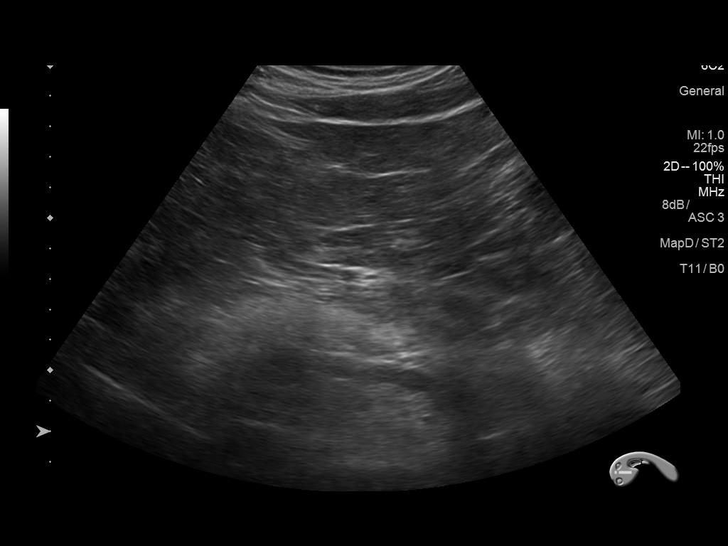
[im 51/101]
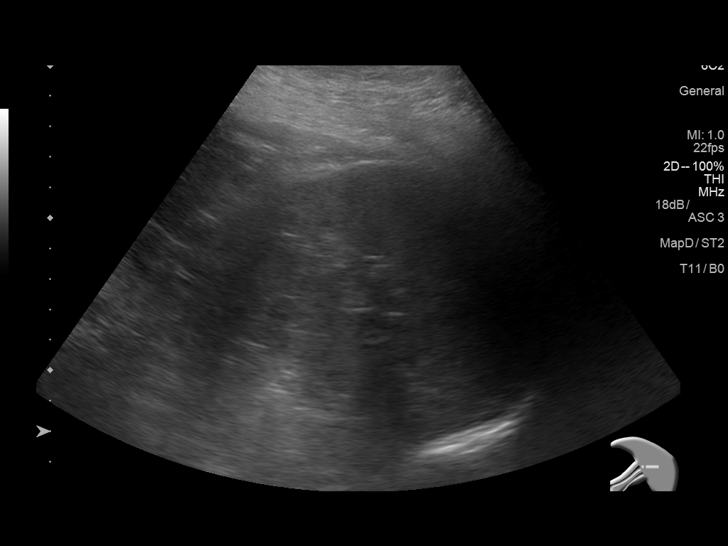
[im 59/101]
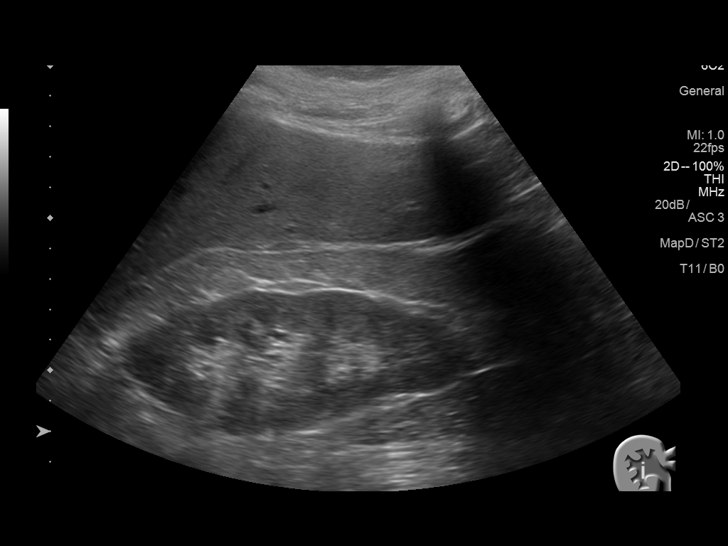
[im 67/101]
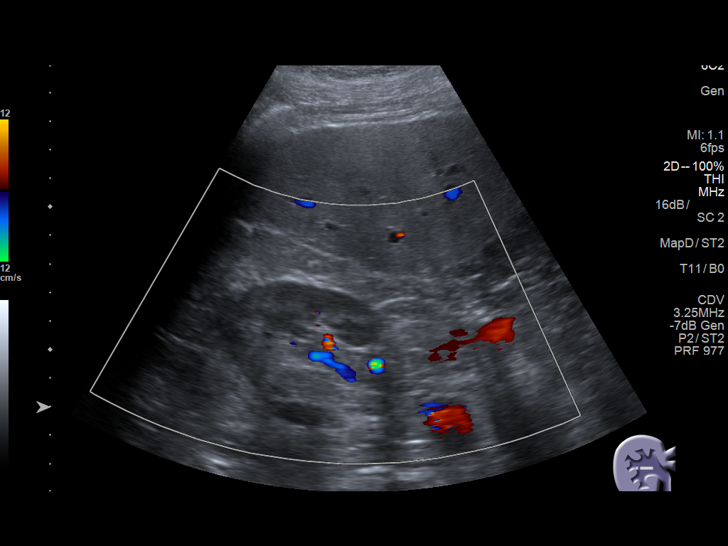
[im 76/101]
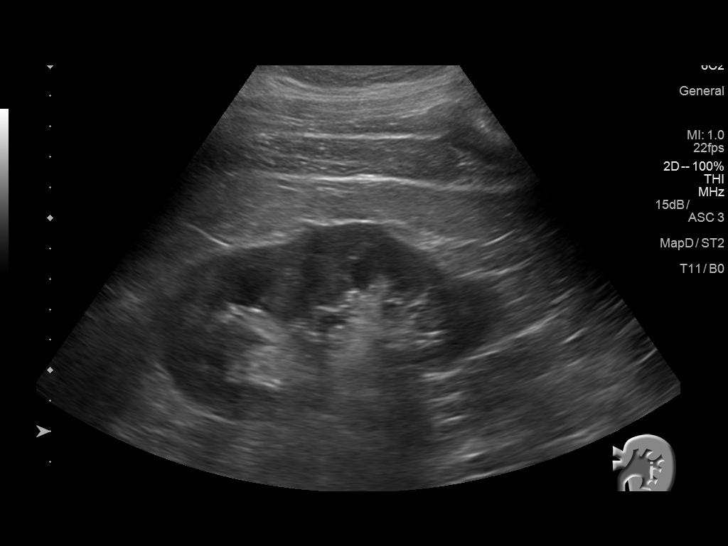
[im 84/101]
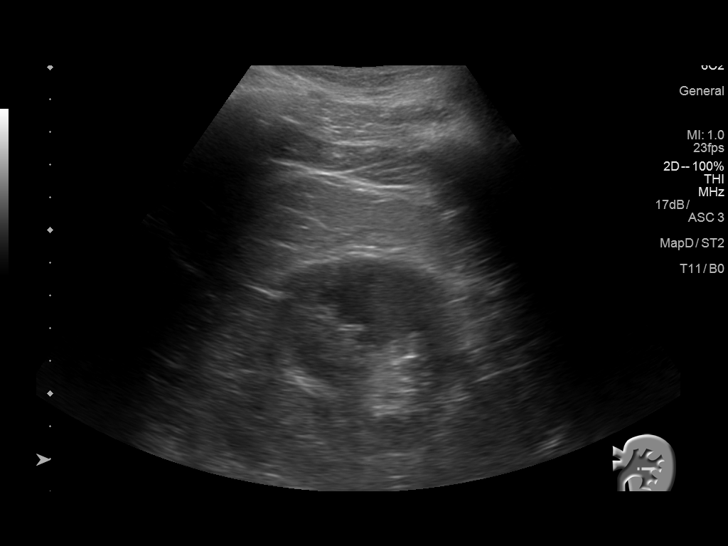
[im 92/101]
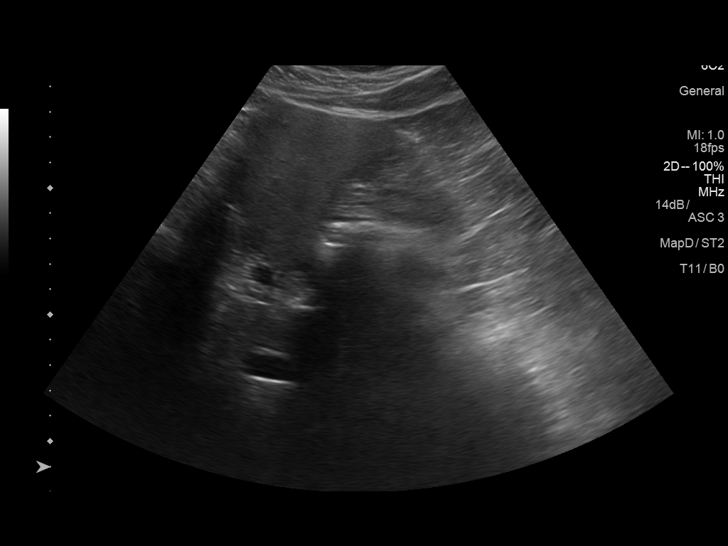
[im 101/101]
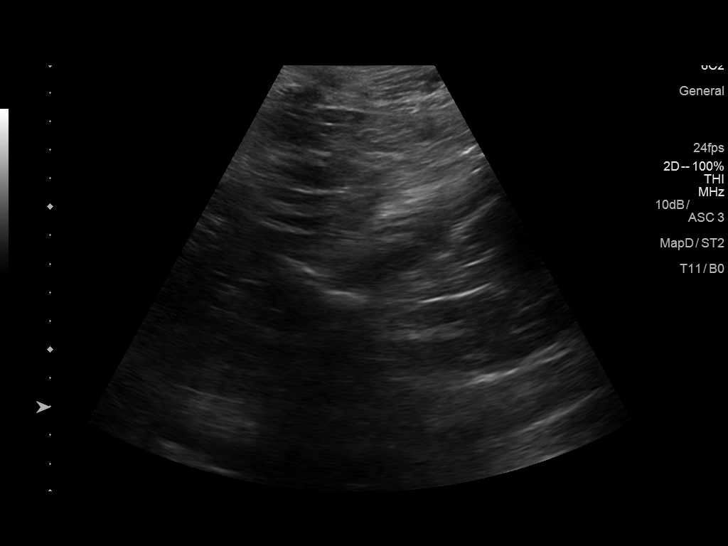

[13 of 25 positions shown; findings below may reference images not displayed]

FINDINGS: Gallbladder: Surgically absent.

Common bile duct: Diameter: 8 mm proximally with tapering distally.
Note that portions of the distal common bile duct are obscured by
gas.

Liver: No focal lesion identified. Within normal limits in
parenchymal echogenicity.

IVC: No abnormality visualized.

Pancreas: Visualized portion unremarkable. Portions of pancreas are
obscured by gas.

Spleen: Size and appearance within normal limits.

Right Kidney: Length: 11.2 cm. Echogenicity within normal limits. No
mass or hydronephrosis visualized.

Left Kidney: Length: 11.2 cm. Echogenicity within normal limits. No
mass or hydronephrosis visualized.

Abdominal aorta: No aneurysm visualized.

Other findings: No demonstrable ascites.
IMPRESSION: Gallbladder absent. Visualized portions of common bile duct appear
unremarkable given post cholecystectomy state. Note that the distal
common bile duct is obscured by gas. Portions of pancreas obscured
by gas. Visualized portions of pancreas appear normal. Study
otherwise unremarkable. In particular, no liver lesions are evident
on this study.

## 2018-03-19 DIAGNOSIS — E1165 Type 2 diabetes mellitus with hyperglycemia: Secondary | ICD-10-CM | POA: Diagnosis not present

## 2018-03-19 DIAGNOSIS — E1169 Type 2 diabetes mellitus with other specified complication: Secondary | ICD-10-CM | POA: Diagnosis not present

## 2018-03-19 DIAGNOSIS — I10 Essential (primary) hypertension: Secondary | ICD-10-CM | POA: Diagnosis not present

## 2018-03-19 DIAGNOSIS — E782 Mixed hyperlipidemia: Secondary | ICD-10-CM | POA: Diagnosis not present

## 2020-07-08 DIAGNOSIS — E785 Hyperlipidemia, unspecified: Secondary | ICD-10-CM | POA: Diagnosis not present

## 2020-07-08 DIAGNOSIS — I10 Essential (primary) hypertension: Secondary | ICD-10-CM | POA: Diagnosis not present

## 2020-07-08 DIAGNOSIS — E1165 Type 2 diabetes mellitus with hyperglycemia: Secondary | ICD-10-CM | POA: Diagnosis not present

## 2020-07-08 DIAGNOSIS — E782 Mixed hyperlipidemia: Secondary | ICD-10-CM | POA: Diagnosis not present

## 2020-07-08 DIAGNOSIS — E1169 Type 2 diabetes mellitus with other specified complication: Secondary | ICD-10-CM | POA: Diagnosis not present

## 2020-07-10 DIAGNOSIS — I10 Essential (primary) hypertension: Secondary | ICD-10-CM | POA: Diagnosis not present

## 2020-07-10 DIAGNOSIS — E782 Mixed hyperlipidemia: Secondary | ICD-10-CM | POA: Diagnosis not present

## 2020-07-10 DIAGNOSIS — E1169 Type 2 diabetes mellitus with other specified complication: Secondary | ICD-10-CM | POA: Diagnosis not present

## 2020-07-10 DIAGNOSIS — K219 Gastro-esophageal reflux disease without esophagitis: Secondary | ICD-10-CM | POA: Diagnosis not present

## 2020-08-09 DIAGNOSIS — W57XXXA Bitten or stung by nonvenomous insect and other nonvenomous arthropods, initial encounter: Secondary | ICD-10-CM | POA: Diagnosis not present

## 2020-08-09 DIAGNOSIS — L02211 Cutaneous abscess of abdominal wall: Secondary | ICD-10-CM | POA: Diagnosis not present

## 2020-08-09 DIAGNOSIS — L089 Local infection of the skin and subcutaneous tissue, unspecified: Secondary | ICD-10-CM | POA: Diagnosis not present

## 2020-09-11 ENCOUNTER — Ambulatory Visit: Payer: BLUE CROSS/BLUE SHIELD | Admitting: Nutrition

## 2020-10-16 DIAGNOSIS — K219 Gastro-esophageal reflux disease without esophagitis: Secondary | ICD-10-CM | POA: Diagnosis not present

## 2020-10-16 DIAGNOSIS — I10 Essential (primary) hypertension: Secondary | ICD-10-CM | POA: Diagnosis not present

## 2020-10-16 DIAGNOSIS — E1165 Type 2 diabetes mellitus with hyperglycemia: Secondary | ICD-10-CM | POA: Diagnosis not present

## 2020-10-16 DIAGNOSIS — E785 Hyperlipidemia, unspecified: Secondary | ICD-10-CM | POA: Diagnosis not present

## 2020-12-20 DIAGNOSIS — M9903 Segmental and somatic dysfunction of lumbar region: Secondary | ICD-10-CM | POA: Diagnosis not present

## 2020-12-20 DIAGNOSIS — M62830 Muscle spasm of back: Secondary | ICD-10-CM | POA: Diagnosis not present

## 2020-12-20 DIAGNOSIS — M9905 Segmental and somatic dysfunction of pelvic region: Secondary | ICD-10-CM | POA: Diagnosis not present

## 2020-12-20 DIAGNOSIS — M9902 Segmental and somatic dysfunction of thoracic region: Secondary | ICD-10-CM | POA: Diagnosis not present

## 2021-01-30 DIAGNOSIS — K219 Gastro-esophageal reflux disease without esophagitis: Secondary | ICD-10-CM | POA: Diagnosis not present

## 2021-01-30 DIAGNOSIS — E1165 Type 2 diabetes mellitus with hyperglycemia: Secondary | ICD-10-CM | POA: Diagnosis not present

## 2021-01-30 DIAGNOSIS — E785 Hyperlipidemia, unspecified: Secondary | ICD-10-CM | POA: Diagnosis not present

## 2021-01-30 DIAGNOSIS — I10 Essential (primary) hypertension: Secondary | ICD-10-CM | POA: Diagnosis not present

## 2021-05-29 DIAGNOSIS — I10 Essential (primary) hypertension: Secondary | ICD-10-CM | POA: Diagnosis not present

## 2021-06-03 DIAGNOSIS — K219 Gastro-esophageal reflux disease without esophagitis: Secondary | ICD-10-CM | POA: Diagnosis not present

## 2021-06-03 DIAGNOSIS — I10 Essential (primary) hypertension: Secondary | ICD-10-CM | POA: Diagnosis not present

## 2021-06-03 DIAGNOSIS — E785 Hyperlipidemia, unspecified: Secondary | ICD-10-CM | POA: Diagnosis not present

## 2021-06-03 DIAGNOSIS — E1165 Type 2 diabetes mellitus with hyperglycemia: Secondary | ICD-10-CM | POA: Diagnosis not present

## 2021-09-03 DIAGNOSIS — I10 Essential (primary) hypertension: Secondary | ICD-10-CM | POA: Diagnosis not present

## 2021-09-12 DIAGNOSIS — K219 Gastro-esophageal reflux disease without esophagitis: Secondary | ICD-10-CM | POA: Diagnosis not present

## 2021-09-12 DIAGNOSIS — E785 Hyperlipidemia, unspecified: Secondary | ICD-10-CM | POA: Diagnosis not present

## 2021-09-12 DIAGNOSIS — E1165 Type 2 diabetes mellitus with hyperglycemia: Secondary | ICD-10-CM | POA: Diagnosis not present

## 2021-09-12 DIAGNOSIS — I10 Essential (primary) hypertension: Secondary | ICD-10-CM | POA: Diagnosis not present

## 2021-12-04 DIAGNOSIS — W57XXXA Bitten or stung by nonvenomous insect and other nonvenomous arthropods, initial encounter: Secondary | ICD-10-CM | POA: Diagnosis not present

## 2021-12-04 DIAGNOSIS — A6920 Lyme disease, unspecified: Secondary | ICD-10-CM | POA: Diagnosis not present

## 2022-04-10 DIAGNOSIS — I10 Essential (primary) hypertension: Secondary | ICD-10-CM | POA: Diagnosis not present

## 2022-04-16 DIAGNOSIS — K219 Gastro-esophageal reflux disease without esophagitis: Secondary | ICD-10-CM | POA: Diagnosis not present

## 2022-04-16 DIAGNOSIS — I10 Essential (primary) hypertension: Secondary | ICD-10-CM | POA: Diagnosis not present

## 2022-04-16 DIAGNOSIS — E119 Type 2 diabetes mellitus without complications: Secondary | ICD-10-CM | POA: Diagnosis not present

## 2022-04-16 DIAGNOSIS — E785 Hyperlipidemia, unspecified: Secondary | ICD-10-CM | POA: Diagnosis not present

## 2022-04-22 ENCOUNTER — Encounter: Payer: Self-pay | Admitting: *Deleted

## 2022-08-09 DIAGNOSIS — W57XXXA Bitten or stung by nonvenomous insect and other nonvenomous arthropods, initial encounter: Secondary | ICD-10-CM | POA: Diagnosis not present

## 2022-08-09 DIAGNOSIS — Z6826 Body mass index (BMI) 26.0-26.9, adult: Secondary | ICD-10-CM | POA: Diagnosis not present

## 2022-08-09 DIAGNOSIS — L0390 Cellulitis, unspecified: Secondary | ICD-10-CM | POA: Diagnosis not present

## 2022-08-09 DIAGNOSIS — E663 Overweight: Secondary | ICD-10-CM | POA: Diagnosis not present

## 2022-08-25 DIAGNOSIS — I10 Essential (primary) hypertension: Secondary | ICD-10-CM | POA: Diagnosis not present

## 2022-08-28 DIAGNOSIS — I10 Essential (primary) hypertension: Secondary | ICD-10-CM | POA: Diagnosis not present

## 2022-08-28 DIAGNOSIS — B54 Unspecified malaria: Secondary | ICD-10-CM | POA: Diagnosis not present

## 2022-08-28 DIAGNOSIS — E785 Hyperlipidemia, unspecified: Secondary | ICD-10-CM | POA: Diagnosis not present

## 2022-08-28 DIAGNOSIS — E119 Type 2 diabetes mellitus without complications: Secondary | ICD-10-CM | POA: Diagnosis not present

## 2022-08-28 DIAGNOSIS — K219 Gastro-esophageal reflux disease without esophagitis: Secondary | ICD-10-CM | POA: Diagnosis not present

## 2022-09-22 ENCOUNTER — Encounter: Payer: Self-pay | Admitting: *Deleted

## 2022-10-22 DIAGNOSIS — E113292 Type 2 diabetes mellitus with mild nonproliferative diabetic retinopathy without macular edema, left eye: Secondary | ICD-10-CM | POA: Diagnosis not present

## 2023-02-24 DIAGNOSIS — I10 Essential (primary) hypertension: Secondary | ICD-10-CM | POA: Diagnosis not present

## 2023-03-03 DIAGNOSIS — R945 Abnormal results of liver function studies: Secondary | ICD-10-CM | POA: Diagnosis not present

## 2023-03-03 DIAGNOSIS — Z0001 Encounter for general adult medical examination with abnormal findings: Secondary | ICD-10-CM | POA: Diagnosis not present

## 2023-03-03 DIAGNOSIS — I10 Essential (primary) hypertension: Secondary | ICD-10-CM | POA: Diagnosis not present

## 2023-03-03 DIAGNOSIS — Z0000 Encounter for general adult medical examination without abnormal findings: Secondary | ICD-10-CM | POA: Diagnosis not present

## 2023-03-03 DIAGNOSIS — E119 Type 2 diabetes mellitus without complications: Secondary | ICD-10-CM | POA: Diagnosis not present

## 2023-03-03 DIAGNOSIS — K219 Gastro-esophageal reflux disease without esophagitis: Secondary | ICD-10-CM | POA: Diagnosis not present

## 2023-03-03 DIAGNOSIS — E785 Hyperlipidemia, unspecified: Secondary | ICD-10-CM | POA: Diagnosis not present

## 2023-05-03 ENCOUNTER — Ambulatory Visit (INDEPENDENT_AMBULATORY_CARE_PROVIDER_SITE_OTHER): Payer: BC Managed Care – PPO | Admitting: Urology

## 2023-05-03 VITALS — BP 130/80 | HR 89

## 2023-05-03 DIAGNOSIS — Z3169 Encounter for other general counseling and advice on procreation: Secondary | ICD-10-CM

## 2023-05-03 NOTE — Progress Notes (Signed)
05/03/2023 3:00 PM   Ryan Coleman March 13, 1971 [id number]  Referring provider: Benita Stabile, MD [address] [place],  [place] [address]  infertility   HPI: Mr Riordan is a 52yo here for evaluation of infertility. He underwent vasectomy and then had a vasectomy reversal in March 2020. He and his wife have been trying to conceive without success. He has 3 children from his previous partner.    PMH: Past Medical History:  Diagnosis Date   GERD (gastroesophageal reflux disease)     Surgical History: Past Surgical History:  Procedure Laterality Date   CHOLECYSTECTOMY  1990    Home Medications:  Allergies as of 05/03/2023   No Known Allergies      Medication List        Accurate as of May 03, 2023  3:00 PM. If you have any questions, ask your nurse or doctor.          Belviq 10 MG Tabs Generic drug: Lorcaserin HCl Take by mouth.   lisinopril 20 MG tablet Commonly known as: ZESTRIL Take 20 mg by mouth daily.   metoprolol tartrate 25 MG tablet Commonly known as: LOPRESSOR Take 1 tablet (25 mg total) by mouth 2 (two) times daily.   omeprazole 20 MG capsule Commonly known as: PRILOSEC Take 20 mg by mouth daily.   pravastatin 40 MG tablet Commonly known as: PRAVACHOL Take 1 tablet (40 mg total) by mouth daily.   Rybelsus 14 MG Tabs Generic drug: Semaglutide Take 1 tablet by mouth daily.   Synjardy XR 25-1000 MG Tb24 Generic drug: Empagliflozin-metFORMIN HCl ER Take 1 tablet by mouth daily.        Allergies: No Known Allergies  Family History: Family History  Problem Relation Age of Onset   COPD Mother    Hypertension Father    Heart disease Father     Social History:  reports that he has quit smoking. He does not have any smokeless tobacco history on file. He reports that he does not drink alcohol. No history on file for drug use.  ROS: All other review of systems were reviewed and are negative except what is noted above in  HPI  Physical Exam: BP 130/80   Pulse 89   Constitutional:  Alert and oriented, No acute distress. HEENT: [place] AT, moist mucus membranes.  Trachea midline, no masses. Cardiovascular: No clubbing, cyanosis, or edema. Respiratory: Normal respiratory effort, no increased work of breathing. GI: Abdomen is soft, nontender, nondistended, no abdominal masses GU: No CVA tenderness. Circumcised phallus. No masses/lesions on penis, testis, scrotum.  Lymph: No cervical or inguinal lymphadenopathy. Skin: No rashes, bruises or suspicious lesions. Neurologic: Grossly intact, no focal deficits, moving all 4 extremities. Psychiatric: Normal mood and affect.  Laboratory Data: Lab Results  Component Value Date   WBC 8.7 01/12/2014   HGB 15.1 01/12/2014   HCT 45.8 01/12/2014   MCV 90.2 01/12/2014    Lab Results  Component Value Date   CREATININE 1.03 01/12/2014    No results found for: "PSA"  No results found for: "TESTOSTERONE"  No results found for: "HGBA1C"  Urinalysis No results found for: "COLORURINE", "APPEARANCEUR", "LABSPEC", "PHURINE", "GLUCOSEU", "HGBUR", "BILIRUBINUR", "KETONESUR", "PROTEINUR", "UROBILINOGEN", "NITRITE", "LEUKOCYTESUR"  No results found for: "LABMICR", "WBCUA", "RBCUA", "LABEPIT", "MUCUS", "BACTERIA"  Pertinent Imaging:  No results found for this or any previous visit.  No results found for this or any previous visit.  No results found for this or any previous visit.  No results  found for this or any previous visit.  No results found for this or any previous visit.  No results found for this or any previous visit.  No results found for this or any previous visit.  No results found for this or any previous visit.   Assessment & Plan:    1. Infertility counseling (Primary) Semen analysis, will call with results   No follow-ups on file.  Wilkie Aye, MD  [facility] [place]

## 2023-05-03 NOTE — Patient Instructions (Signed)
Please contact [employer] at [phone] to schedule semen analysis. You will be required to bring the lab order and the specimen cups provided by our office to [address] Your [place] [address], use entrance [address].

## 2023-05-11 ENCOUNTER — Encounter: Payer: Self-pay | Admitting: Urology

## 2023-05-18 DIAGNOSIS — Z3169 Encounter for other general counseling and advice on procreation: Secondary | ICD-10-CM | POA: Diagnosis not present

## 2023-05-19 LAB — SEMEN ANALYSIS, BASIC
Appearance: NORMAL
Concentration, Sperm: <2 x10E6/mL — ABNORMAL LOW (ref >14.9)
Leukocyte Concentration: <1 x10E6/mL (ref <1.00)
Time Collected: 1610
Time Received: 1630
Time Since Last Emission: 2 d
Volume: 1.1 mL — ABNORMAL LOW (ref >1.4)
pH: 8.5 (ref >7.1)

## 2023-06-01 ENCOUNTER — Telehealth: Payer: Self-pay

## 2023-06-01 NOTE — Telephone Encounter (Signed)
 Patient called and made aware of semen analysis results.

## 2023-06-01 NOTE — Telephone Encounter (Signed)
-----   Message from Wilkie Aye sent at 06/01/2023  9:07 AM EDT ----- Semen analysis shows no sperm ----- Message ----- From: Nell Range Lab Results In Sent: 05/19/2023   5:37 AM EDT To: Malen Gauze, MD

## 2023-06-21 DIAGNOSIS — L03313 Cellulitis of chest wall: Secondary | ICD-10-CM | POA: Diagnosis not present

## 2023-06-21 DIAGNOSIS — W57XXXA Bitten or stung by nonvenomous insect and other nonvenomous arthropods, initial encounter: Secondary | ICD-10-CM | POA: Diagnosis not present

## 2023-06-21 DIAGNOSIS — R030 Elevated blood-pressure reading, without diagnosis of hypertension: Secondary | ICD-10-CM | POA: Diagnosis not present

## 2023-06-21 DIAGNOSIS — Z6824 Body mass index (BMI) 24.0-24.9, adult: Secondary | ICD-10-CM | POA: Diagnosis not present

## 2023-08-27 DIAGNOSIS — Z125 Encounter for screening for malignant neoplasm of prostate: Secondary | ICD-10-CM | POA: Diagnosis not present

## 2023-08-27 DIAGNOSIS — I10 Essential (primary) hypertension: Secondary | ICD-10-CM | POA: Diagnosis not present

## 2023-10-22 DIAGNOSIS — E119 Type 2 diabetes mellitus without complications: Secondary | ICD-10-CM | POA: Diagnosis not present

## 2023-10-22 DIAGNOSIS — E785 Hyperlipidemia, unspecified: Secondary | ICD-10-CM | POA: Diagnosis not present

## 2023-10-22 DIAGNOSIS — I10 Essential (primary) hypertension: Secondary | ICD-10-CM | POA: Diagnosis not present

## 2023-10-22 DIAGNOSIS — L309 Dermatitis, unspecified: Secondary | ICD-10-CM | POA: Diagnosis not present

## 2023-11-03 DIAGNOSIS — E11319 Type 2 diabetes mellitus with unspecified diabetic retinopathy without macular edema: Secondary | ICD-10-CM | POA: Diagnosis not present

## 2024-01-14 DIAGNOSIS — Z1211 Encounter for screening for malignant neoplasm of colon: Secondary | ICD-10-CM | POA: Diagnosis not present
